# Patient Record
Sex: Female | Born: 1945 | Race: White | Hispanic: No | Marital: Married | Smoking: Never smoker
Health system: Southern US, Community
[De-identification: ages and names within clinical notes are randomized; demographics above are authoritative.]

## PROBLEM LIST (undated history)

## (undated) DIAGNOSIS — H1851 Endothelial corneal dystrophy: Secondary | ICD-10-CM

## (undated) DIAGNOSIS — L409 Psoriasis, unspecified: Secondary | ICD-10-CM

## (undated) DIAGNOSIS — I10 Essential (primary) hypertension: Secondary | ICD-10-CM

## (undated) DIAGNOSIS — K219 Gastro-esophageal reflux disease without esophagitis: Secondary | ICD-10-CM

## (undated) DIAGNOSIS — H269 Unspecified cataract: Secondary | ICD-10-CM

## (undated) DIAGNOSIS — M1990 Unspecified osteoarthritis, unspecified site: Secondary | ICD-10-CM

## (undated) DIAGNOSIS — D0510 Intraductal carcinoma in situ of unspecified breast: Secondary | ICD-10-CM

## (undated) DIAGNOSIS — K5190 Ulcerative colitis, unspecified, without complications: Secondary | ICD-10-CM

## (undated) DIAGNOSIS — M810 Age-related osteoporosis without current pathological fracture: Secondary | ICD-10-CM

## (undated) DIAGNOSIS — E559 Vitamin D deficiency, unspecified: Secondary | ICD-10-CM

## (undated) HISTORY — DX: Psoriasis, unspecified: L40.9

## (undated) HISTORY — DX: Essential (primary) hypertension: I10

## (undated) HISTORY — DX: Age-related osteoporosis without current pathological fracture: M81.0

## (undated) HISTORY — DX: Vitamin D deficiency, unspecified: E55.9

## (undated) HISTORY — DX: Ulcerative colitis, unspecified, without complications: K51.90

## (undated) HISTORY — DX: Unspecified cataract: H26.9

## (undated) HISTORY — DX: Endothelial corneal dystrophy: H18.51

## (undated) HISTORY — DX: Unspecified osteoarthritis, unspecified site: M19.90

## (undated) HISTORY — DX: Intraductal carcinoma in situ of unspecified breast: D05.10

---

## 1976-08-11 HISTORY — PX: TUBAL LIGATION: SHX77

## 1983-08-12 DIAGNOSIS — K5190 Ulcerative colitis, unspecified, without complications: Secondary | ICD-10-CM

## 1983-08-12 HISTORY — DX: Ulcerative colitis, unspecified, without complications: K51.90

## 2006-08-11 DIAGNOSIS — H18519 Endothelial corneal dystrophy, unspecified eye: Secondary | ICD-10-CM

## 2006-08-11 HISTORY — DX: Endothelial corneal dystrophy, unspecified eye: H18.519

## 2011-08-12 HISTORY — PX: OTHER SURGICAL HISTORY: SHX169

## 2013-08-11 DIAGNOSIS — I10 Essential (primary) hypertension: Secondary | ICD-10-CM

## 2013-08-11 HISTORY — DX: Essential (primary) hypertension: I10

## 2016-08-11 HISTORY — PX: BREAST BIOPSY: SHX20

## 2016-12-25 DIAGNOSIS — H269 Unspecified cataract: Secondary | ICD-10-CM

## 2016-12-25 HISTORY — DX: Unspecified cataract: H26.9

## 2017-01-21 ENCOUNTER — Other Ambulatory Visit: Payer: Self-pay | Admitting: Family Medicine

## 2017-01-21 DIAGNOSIS — N6489 Other specified disorders of breast: Secondary | ICD-10-CM

## 2017-01-23 ENCOUNTER — Ambulatory Visit
Admission: RE | Admit: 2017-01-23 | Discharge: 2017-01-23 | Disposition: A | Discharge status: Home / Self Care | Payer: Medicare Other | Source: Ambulatory Visit | Attending: Family Medicine | Admitting: Family Medicine

## 2017-01-23 ENCOUNTER — Other Ambulatory Visit: Payer: Self-pay | Admitting: Family Medicine

## 2017-01-23 DIAGNOSIS — N6489 Other specified disorders of breast: Secondary | ICD-10-CM

## 2017-01-23 DIAGNOSIS — D0510 Intraductal carcinoma in situ of unspecified breast: Secondary | ICD-10-CM

## 2017-01-23 HISTORY — DX: Intraductal carcinoma in situ of unspecified breast: D05.10

## 2017-01-23 HISTORY — PX: BIOPSY BREAST: PRO8

## 2017-03-04 ENCOUNTER — Encounter: Payer: Self-pay | Admitting: Hematology and Oncology

## 2017-03-04 ENCOUNTER — Telehealth: Payer: Self-pay | Admitting: Hematology and Oncology

## 2017-03-04 NOTE — Telephone Encounter (Signed)
Appt has been scheduled for the Janet Wiggins to see Dr. Pamelia HoitGudena on 7/31 at 345pm. Alisha from [facility] has notified the Janet Wiggins of the appt. Letter mailed.

## 2017-03-09 ENCOUNTER — Telehealth: Payer: Self-pay | Admitting: *Deleted

## 2017-03-09 NOTE — Telephone Encounter (Signed)
Clinical Research Note:  Patient referred by Dr. Dwain SarnaWakefield for Comet AFT-25 study.   She is scheduled to see Dr. Pamelia HoitGudena tomorrow afternoon.  Called patient to introduce myself and ask if she is still interested in learning about this clinical trial.  Patient states she is interested in learning more about the study.  I will plan to provide her with information about the study and answer any questions she may have after speaking with Dr. Pamelia HoitGudena.   Patient agreed to have research nurse present on her visit w/ Dr. Pamelia HoitGudena tomorrow and to meet afterwards.  Thanked patient for her time and interest in this study and look forward to meeting her tomorrow.  Domenica Reamerameo Windham, BSN, RN Clinical Research Nurse 03/09/2017 11:34 AM

## 2017-03-10 ENCOUNTER — Encounter: Payer: Self-pay | Admitting: Hematology and Oncology

## 2017-03-10 ENCOUNTER — Encounter: Payer: Self-pay | Admitting: *Deleted

## 2017-03-10 ENCOUNTER — Ambulatory Visit (HOSPITAL_BASED_OUTPATIENT_CLINIC_OR_DEPARTMENT_OTHER): Payer: Medicare Other | Admitting: Hematology and Oncology

## 2017-03-10 DIAGNOSIS — D0512 Intraductal carcinoma in situ of left breast: Secondary | ICD-10-CM | POA: Diagnosis not present

## 2017-03-10 DIAGNOSIS — Z170 Estrogen receptor positive status [ER+]: Secondary | ICD-10-CM

## 2017-03-10 DIAGNOSIS — D0511 Intraductal carcinoma in situ of right breast: Secondary | ICD-10-CM

## 2017-03-10 NOTE — Progress Notes (Signed)
[facility] CONSULT NOTE  Patient Care Team: Gillian ScarceZanard, Robyn K, MD as PCP - General (Family Medicine)  CHIEF COMPLAINTS/PURPOSE OF CONSULTATION:  Newly diagnosed bilateral DCIS  HISTORY OF PRESENTING ILLNESS:  Janet Wiggins 71 y.o. female is here because of recent diagnosis of bilateral DCIS patient has not had a mammogram for several years and had a routine screening mammogram since she moved to [place] to be close to her family. The mammogram revealed bilateral breast abnormalities. She underwent biopsies of both of the breasts and both came back as low to do. Grade DCIS. Both were ER/PR positive. She was seen by surgery and was referred to was for discussion regarding treatment options. She was informed about the common clinical trial and was interested in this. She is accompanied today by her son and her husband.   I reviewed her records extensively and collaborated the history with the patient.  SUMMARY OF ONCOLOGIC HISTORY:   Ductal carcinoma in situ (DCIS) of right breast   01/23/2017 Initial Diagnosis    Right breast biopsy 12:30 position: DCIS involving sclerosing adenosis ER 100%, PR 100%; left breast upper medial biopsy: DCIS involving sclerosing adenosis, low to intermediate grade, ER 100%, PR 95% , Tis Nx stage 0     DCIS left breast   MEDICAL HISTORY:   long-standing history of ulcerative colitis  SURGICAL HISTORY:  recent cataract surgeries  SOCIAL HISTORY:Denies any tobacco alcohol integration drug use  FAMILY HISTORY: No family history of breast cancer   ALLERGIES:  has No Known Allergies.  MEDICATIONS:  Current Outpatient Prescriptions  Medication Sig Dispense Refill  . aspirin 81 MG chewable tablet Chew 81 mg by mouth daily.    . diclofenac sodium (VOLTAREN) 1 % GEL Apply topically as needed.    . ergocalciferol (VITAMIN D2) 50000 units capsule Take 50,000 Units by mouth every 14 (fourteen) days.    . fluocinonide ointment (LIDEX)  0.05 % Apply 1 application topically as needed.    Marland Kitchen. glucosamine-chondroitin 500-400 MG tablet Take 1 tablet by mouth daily.    Marland Kitchen. lisinopril (PRINIVIL,ZESTRIL) 20 MG tablet Take 20 mg by mouth daily.    . mesalamine (LIALDA) 1.2 g EC tablet Take by mouth every evening.    . Multiple Vitamin (MULTIVITAMIN) tablet Take 1 tablet by mouth daily.    . zoledronic acid (RECLAST) 5 MG/100ML SOLN injection Inject 5 mg into the vein once. Once a year. Last given in December 2017.     No current facility-administered medications for this visit.     REVIEW OF SYSTEMS:   Constitutional: Denies fevers, chills or abnormal night sweats Eyes: Denies blurriness of vision, double vision or watery eyes Ears, nose, mouth, throat, and face: Denies mucositis or sore throat Respiratory: Denies cough, dyspnea or wheezes Cardiovascular: Denies palpitation, chest discomfort or lower extremity swelling Gastrointestinal:  Denies nausea, heartburn or change in bowel habits Skin: Denies abnormal skin rashes Lymphatics: Denies new lymphadenopathy or easy bruising Neurological:Denies numbness, tingling or new weaknesses Behavioral/Psych: Mood is stable, no new changes  Breast:  Denies any palpable lumps or discharge All other systems were reviewed with the patient and are negative.  PHYSICAL EXAMINATION: ECOG PERFORMANCE STATUS: 0 - Asymptomatic  Vitals:   03/10/17 1554  BP: (!) 145/79  Pulse: 76  Resp: 18  Temp: 98.4 F (36.9 C)   Filed Weights   03/10/17 1554  Weight: 185 lb 14.4 oz (84.3 kg)    GENERAL:alert, no distress and comfortable SKIN:  skin color, texture, turgor are normal, no rashes or significant lesions EYES: normal, conjunctiva are pink and non-injected, sclera clear OROPHARYNX:no exudate, no erythema and lips, buccal mucosa, and tongue normal  NECK: supple, thyroid normal size, non-tender, without nodularity LYMPH:  no palpable lymphadenopathy in the cervical, axillary or inguinal LUNGS:  clear to auscultation and percussion with normal breathing effort HEART: regular rate & rhythm and no murmurs and no lower extremity edema ABDOMEN:abdomen soft, non-tender and normal bowel sounds Musculoskeletal:no cyanosis of digits and no clubbing  PSYCH: alert & oriented x 3 with fluent speech NEURO: no focal motor/sensory deficits BREAST: No palpable nodules in breast. No palpable axillary or supraclavicular lymphadenopathy (exam performed in the presence of a chaperone)   ASSESSMENT AND PLAN:  Ductal carcinoma in situ (DCIS) of right breast Right breast biopsy 12:30 position: DCIS involving sclerosing adenosis ER 100%, PR 100%;  Left breast upper medial biopsy: DCIS involving alcohol sclerosing adenosis, low to intermediate grade, ER 100%, PR 95% ,  Bilateral breast DCIS Tis Nx stage 0  Pathology review: I discussed with the patient the difference between DCIS and invasive breast cancer. It is considered a precancerous lesion. DCIS is classified as a 0. It is generally detected through mammograms as calcifications. We discussed the significance of grades and its impact on prognosis. We also discussed the importance of ER and PR receptors and their implications to adjuvant treatment options. Prognosis of DCIS dependence on grade, comedo necrosis. It is anticipated that if not treated, 20-30% of DCIS can develop into invasive breast cancer.  Recommendation: 1. Breast conserving surgeries Vs COMET Clinical Trial (surgery versus active surveillance) 2. Followed by adjuvant radiation therapy 3. Followed by antiestrogen therapy with tamoxifen 5 years  Tamoxifen counseling: We discussed the risks and benefits of tamoxifen. These include but not limited to insomnia, hot flashes, mood changes, vaginal dryness, and weight gain. Although rare, serious side effects including endometrial cancer, risk of blood clots were also discussed. We strongly believe that the benefits far outweigh the risks.  Patient understands these risks  AFT 25 COMET Phase 3 clinical trial for low risk DCIS grade 1/2 PR positive, age greater than 40 randomized to surgery +/- radiation, +/- endocrine therapy versus active surveillance with +/- endocrine therapy surveillance with mammograms every 6 months for 5 years;patient's have option to decline elevated arm and still be followed on study   Patient was provided a lot of information about the clinical trial and she will get back to us with her decision regarding participating in this important trial.  All questions were answered. The patient knows to call the clinic with any problems, questions or concerns.    Sabas SousGudena, Windi Toro K, MD 03/10/17

## 2017-03-10 NOTE — Assessment & Plan Note (Signed)
Right breast biopsy 12:30 position: DCIS involving sclerosing adenosis ER 100%, PR 100%;  Left breast upper medial biopsy: DCIS involving alcohol sclerosing adenosis, low to intermediate grade, ER 100%, PR 95% ,  Bilateral breast DCIS Tis Nx stage 0  Pathology review: I discussed with the patient the difference between DCIS and invasive breast cancer. It is considered a precancerous lesion. DCIS is classified as a 0. It is generally detected through mammograms as calcifications. We discussed the significance of grades and its impact on prognosis. We also discussed the importance of ER and PR receptors and their implications to adjuvant treatment options. Prognosis of DCIS dependence on grade, comedo necrosis. It is anticipated that if not treated, 20-30% of DCIS can develop into invasive breast cancer.  Recommendation: 1. Breast conserving surgeries 2. Followed by adjuvant radiation therapy 3. Followed by antiestrogen therapy with tamoxifen 5 years  Tamoxifen counseling: We discussed the risks and benefits of tamoxifen. These include but not limited to insomnia, hot flashes, mood changes, vaginal dryness, and weight gain. Although rare, serious side effects including endometrial cancer, risk of blood clots were also discussed. We strongly believe that the benefits far outweigh the risks. Patient understands these risks and consented to starting treatment. Planned treatment duration is 5 years.  Return to clinic after surgery to discuss the final pathology report and come up with an adjuvant treatment plan.

## 2017-03-18 ENCOUNTER — Encounter: Payer: Self-pay | Admitting: *Deleted

## 2017-03-18 ENCOUNTER — Telehealth: Payer: Self-pay | Admitting: *Deleted

## 2017-03-18 NOTE — Telephone Encounter (Signed)
Called pt to discuss decision on whether she wanted to move forward with surgery or COMET trial. Pt relate she has decided for now she will move forward with the COMET trial. She is scheduled to meet with research to discuss details on 8/13. Denies further needs or questions at this time.

## 2017-03-19 ENCOUNTER — Other Ambulatory Visit: Payer: Self-pay | Admitting: *Deleted

## 2017-03-19 DIAGNOSIS — D0511 Intraductal carcinoma in situ of right breast: Secondary | ICD-10-CM

## 2017-03-19 DIAGNOSIS — D0512 Intraductal carcinoma in situ of left breast: Secondary | ICD-10-CM

## 2017-03-20 ENCOUNTER — Telehealth: Payer: Self-pay | Admitting: *Deleted

## 2017-03-20 NOTE — Telephone Encounter (Signed)
Research Note for Comet study (Aft-25) Called patient to notify her she is not eligible for the Comet study due to no measurable disease on imaging studies.  Thanked patient for her time and willingness to participate in this study. She verbalized understanding.   Informed patient that she can choose her treatment options for DCIS off study. She can continue to see Dr. Pamelia HoitGudena and be followed on tamoxifen without surgery and radiation or she can choose to proceed with surgery and radiation followed by anti-hormone treatment. Patient says she thinks she will probably want to start on Tamoxifen and be followed by Dr. Pamelia HoitGudena without surgery and radiation.  She will think about it over the weekend and I will ask the Breast Navigator or Collaborative RN to follow up with her next week to discuss further.  Patient verbalized understanding.  Domenica Reamerameo Kaysen Deal, BSN, RN Clinical Research Nurse 03/20/2017 2:57 PM

## 2017-03-23 ENCOUNTER — Other Ambulatory Visit: Payer: Medicare Other

## 2017-03-23 ENCOUNTER — Encounter: Payer: Medicare Other | Admitting: *Deleted

## 2017-03-24 ENCOUNTER — Other Ambulatory Visit: Payer: Self-pay | Admitting: Gastroenterology

## 2017-03-24 DIAGNOSIS — R1310 Dysphagia, unspecified: Secondary | ICD-10-CM

## 2017-03-30 ENCOUNTER — Telehealth: Payer: Self-pay | Admitting: Hematology and Oncology

## 2017-03-30 NOTE — Telephone Encounter (Signed)
sw pt husband to confirm 8/28 appt at 145 pm per sch msg

## 2017-04-06 ENCOUNTER — Ambulatory Visit
Admission: RE | Admit: 2017-04-06 | Discharge: 2017-04-06 | Disposition: A | Discharge status: Home / Self Care | Payer: Medicare Other | Source: Ambulatory Visit | Attending: Gastroenterology | Admitting: Gastroenterology

## 2017-04-06 DIAGNOSIS — R1310 Dysphagia, unspecified: Secondary | ICD-10-CM

## 2017-04-07 ENCOUNTER — Ambulatory Visit (HOSPITAL_BASED_OUTPATIENT_CLINIC_OR_DEPARTMENT_OTHER): Payer: Medicare Other | Admitting: Hematology and Oncology

## 2017-04-07 ENCOUNTER — Encounter: Payer: Self-pay | Admitting: Hematology and Oncology

## 2017-04-07 VITALS — BP 127/84 | HR 83 | Temp 98.7°F | Resp 18 | Ht 64.75 in | Wt 184.7 lb

## 2017-04-07 DIAGNOSIS — Z79810 Long term (current) use of selective estrogen receptor modulators (SERMs): Secondary | ICD-10-CM | POA: Diagnosis not present

## 2017-04-07 DIAGNOSIS — D0512 Intraductal carcinoma in situ of left breast: Secondary | ICD-10-CM

## 2017-04-07 DIAGNOSIS — D0511 Intraductal carcinoma in situ of right breast: Secondary | ICD-10-CM | POA: Diagnosis not present

## 2017-04-07 DIAGNOSIS — Z170 Estrogen receptor positive status [ER+]: Secondary | ICD-10-CM | POA: Diagnosis not present

## 2017-04-07 MED ORDER — TAMOXIFEN CITRATE 20 MG PO TABS: 20.0000 mg | ORAL_TABLET | Freq: Every day | ORAL | 3 refills | Status: DC

## 2017-04-07 NOTE — Assessment & Plan Note (Signed)
Right breast biopsy 12:30 position: DCIS involving sclerosing adenosis ER 100%, PR 100%;  Left breast upper medial biopsy: DCIS involving alcohol sclerosing adenosis, low to intermediate grade, ER 100%, PR 95% ,  Bilateral breast DCIS Tis Nx stage 0  Patient was not eligible to participate in COMET clinical trial because she did not have measurable disease. Patient's preference is not to undergo surgery.  Treatment plan: 1. Antiestrogen therapy with tamoxifen 2. every 6 month mammograms and breast exams and follow-up.  I sent a prescription for tamoxifen today. Return to clinic in 6 months with mammogram and follow-up.

## 2017-04-07 NOTE — Progress Notes (Signed)
Patient Care Team: Gillian Scarce, MD as PCP - General (Family Medicine)  DIAGNOSIS:  Encounter Diagnoses  Name Primary?  . Ductal carcinoma in situ (DCIS) of right breast   . Ductal carcinoma in situ (DCIS) of left breast Yes    SUMMARY OF ONCOLOGIC HISTORY:   Ductal carcinoma in situ (DCIS) of right breast   01/23/2017 Initial Diagnosis    Right breast biopsy 12:30 position: DCIS involving sclerosing adenosis ER 100%, PR 100%; left breast upper medial biopsy: DCIS involving sclerosing adenosis, low to intermediate grade, ER 100%, PR 95% , Tis Nx stage 0      04/07/2017 -  Anti-estrogen oral therapy    Tamoxifen 20 mg daily, patient decided to go on active surveillance with every 6 month mammograms       CHIEF COMPLIANT: Follow-up of DCIS in bilateral breasts  INTERVAL HISTORY: Janet Wiggins is a 71 year old with above-mentioned history of DCIS of bilateral breasts was here for follow-up to discuss the treatment plan. We originally wanted her to participate in a clinical trial called comet however she did not qualify for the trial because she did not have measurable disease. She however decided to go on active surveillance without undergoing surgery. She is here to discuss that option along with starting tamoxifen therapy.  REVIEW OF SYSTEMS:   Constitutional: Denies fevers, chills or abnormal weight loss Eyes: Denies blurriness of vision Ears, nose, mouth, throat, and face: Denies mucositis or sore throat Respiratory: Denies cough, dyspnea or wheezes Cardiovascular: Denies palpitation, chest discomfort Gastrointestinal:  Denies nausea, heartburn or change in bowel habits Skin: Denies abnormal skin rashes Lymphatics: Denies new lymphadenopathy or easy bruising Neurological:Denies numbness, tingling or new weaknesses Behavioral/Psych: Mood is stable, no new changes  Extremities: No lower extremity edema Breast:  denies any pain or lumps or nodules in either breasts All other  systems were reviewed with the patient and are negative.  I have reviewed the past medical history, past surgical history, social history and family history with the patient and they are unchanged from previous note.  ALLERGIES:  has No Known Allergies.  MEDICATIONS:  Current Outpatient Prescriptions  Medication Sig Dispense Refill  . aspirin 81 MG chewable tablet Chew 81 mg by mouth daily.    . diclofenac sodium (VOLTAREN) 1 % GEL Apply topically as needed.    . ergocalciferol (VITAMIN D2) 50000 units capsule Take 50,000 Units by mouth every 14 (fourteen) days.    . fluocinonide ointment (LIDEX) 0.05 % Apply 1 application topically as needed.    Marland Kitchen glucosamine-chondroitin 500-400 MG tablet Take 1 tablet by mouth daily.    Marland Kitchen lisinopril (PRINIVIL,ZESTRIL) 20 MG tablet Take 20 mg by mouth daily.    . mesalamine (LIALDA) 1.2 g EC tablet Take by mouth every evening.    . Multiple Vitamin (MULTIVITAMIN) tablet Take 1 tablet by mouth daily.    . tamoxifen (NOLVADEX) 20 MG tablet Take 1 tablet (20 mg total) by mouth daily. 90 tablet 3  . zoledronic acid (RECLAST) 5 MG/100ML SOLN injection Inject 5 mg into the vein once. Once a year. Last given in December 2017.     No current facility-administered medications for this visit.     PHYSICAL EXAMINATION: ECOG PERFORMANCE STATUS: 0 - Asymptomatic  Vitals:   04/07/17 1402  BP: 127/84  Pulse: 83  Resp: 18  Temp: 98.7 F (37.1 C)  SpO2: 96%   Filed Weights   04/07/17 1402  Weight: 184 lb 11.2 oz (83.8 kg)  GENERAL:alert, no distress and comfortable SKIN: skin color, texture, turgor are normal, no rashes or significant lesions EYES: normal, Conjunctiva are pink and non-injected, sclera clear OROPHARYNX:no exudate, no erythema and lips, buccal mucosa, and tongue normal  NECK: supple, thyroid normal size, non-tender, without nodularity LYMPH:  no palpable lymphadenopathy in the cervical, axillary or inguinal LUNGS: clear to auscultation  and percussion with normal breathing effort HEART: regular rate & rhythm and no murmurs and no lower extremity edema ABDOMEN:abdomen soft, non-tender and normal bowel sounds MUSCULOSKELETAL:no cyanosis of digits and no clubbing  NEURO: alert & oriented x 3 with fluent speech, no focal motor/sensory deficits EXTREMITIES: No lower extremity edema BREAST: No palpable masses or nodules in either right or left breasts. No palpable axillary supraclavicular or infraclavicular adenopathy no breast tenderness or nipple discharge. 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 Patient's preference is not to undergo surgery.  Treatment plan: 1. Antiestrogen therapy with tamoxifen 20 mg daily starting 04/07/2017 2. every 6 month mammograms and breast exams and follow-up.  I sent a prescription for tamoxifen today. Return to clinic in 3 months for toxicity evaluation      I spent 25 minutes talking to the patient of which more than half was spent in counseling and coordination of care.  Orders Placed This Encounter  Procedures  . MM DIAG BREAST TOMO BILATERAL    Standing Status:   Future    Standing Expiration Date:   04/07/2018    Order Specific Question:   Reason for Exam (SYMPTOM  OR DIAGNOSIS REQUIRED)    Answer:    Every 6 month mammogram for DCIS under active surveillance    Order Specific Question:   Preferred imaging location?    Answer:   [facility]   The patient has a good understanding of the overall plan. she agrees with it. she will call with any problems that may develop before the next visit here.   Sabas Sous, MD 04/07/17

## 2017-04-09 ENCOUNTER — Other Ambulatory Visit: Payer: Self-pay

## 2017-04-09 MED ORDER — TAMOXIFEN CITRATE 20 MG PO TABS: 20.0000 mg | ORAL_TABLET | Freq: Every day | ORAL | 3 refills | Status: DC

## 2017-07-13 ENCOUNTER — Ambulatory Visit: Payer: Medicare Other | Admitting: Hematology and Oncology

## 2017-07-23 ENCOUNTER — Ambulatory Visit
Admission: RE | Admit: 2017-07-23 | Discharge: 2017-07-23 | Disposition: A | Discharge status: Home / Self Care | Payer: Medicare Other | Source: Ambulatory Visit | Attending: Hematology and Oncology | Admitting: Hematology and Oncology

## 2017-07-23 DIAGNOSIS — D0511 Intraductal carcinoma in situ of right breast: Secondary | ICD-10-CM

## 2017-07-27 NOTE — Assessment & Plan Note (Signed)
Right breast biopsy 12:30 position: DCIS involving sclerosing adenosis ER 100%, PR 100%;  Left breast upper medial biopsy: DCIS involving alcohol sclerosing adenosis, low to intermediate grade, ER 100%, PR 95% ,  Bilateral breast DCIS Tis Nx stage 0  Patient was not eligible to participate in COMET clinical trial because she did not have measurable disease. Patient's preference is not to undergo surgery.  Treatment plan: 1. Antiestrogen therapy with tamoxifen 20 mg daily starting 04/07/2017 2. every 6 month mammograms and breast exams and follow-up.   Return to clinic in 6 months for toxicity evaluation

## 2017-07-28 ENCOUNTER — Telehealth: Payer: Self-pay | Admitting: Hematology and Oncology

## 2017-07-28 ENCOUNTER — Ambulatory Visit (HOSPITAL_BASED_OUTPATIENT_CLINIC_OR_DEPARTMENT_OTHER): Payer: Medicare Other | Admitting: Hematology and Oncology

## 2017-07-28 DIAGNOSIS — Z79810 Long term (current) use of selective estrogen receptor modulators (SERMs): Secondary | ICD-10-CM | POA: Diagnosis not present

## 2017-07-28 DIAGNOSIS — D0511 Intraductal carcinoma in situ of right breast: Secondary | ICD-10-CM

## 2017-07-28 NOTE — Telephone Encounter (Signed)
Gave patient avs and calendar with appts per 12/18 los.  °

## 2017-07-28 NOTE — Progress Notes (Signed)
Patient Care Team: Gillian ScarceZanard, Robyn K, MD as PCP - General (Family Medicine)  DIAGNOSIS:  Encounter Diagnosis  Name Primary?  . Ductal carcinoma in situ (DCIS) of right breast     SUMMARY OF ONCOLOGIC HISTORY:   Ductal carcinoma in situ (DCIS) of right breast   01/23/2017 Initial Diagnosis    Right breast biopsy 12:30 position: DCIS involving sclerosing adenosis ER 100%, PR 100%; left breast upper medial biopsy: DCIS involving sclerosing adenosis, low to intermediate grade, ER 100%, PR 95% , Tis Nx stage 0      04/07/2017 -  Anti-estrogen oral therapy    Tamoxifen 20 mg daily, patient decided to go on active surveillance with every 6 month mammograms       CHIEF COMPLIANT: Follow-up of DCIS on tamoxifen  INTERVAL HISTORY: Liborio NixonJanice Trow is a 71 year old with above-mentioned history of right and left breast DCIS who is currently on tamoxifen therapy.  She did not want to undergo surgery.  She is under surveillance.  Recent mammogram showed slight increase in the density around bilateral breast which could be post biopsy changes.  They are recommending a 6159-month interval mammogram.  She denies any pain discomfort lumps or nodules.  REVIEW OF SYSTEMS:   Constitutional: Denies fevers, chills or abnormal weight loss Eyes: Denies blurriness of vision Ears, nose, mouth, throat, and face: Denies mucositis or sore throat Respiratory: Denies cough, dyspnea or wheezes Cardiovascular: Denies palpitation, chest discomfort Gastrointestinal:  Denies nausea, heartburn or change in bowel habits Skin: Denies abnormal skin rashes Lymphatics: Denies new lymphadenopathy or easy bruising Neurological:Denies numbness, tingling or new weaknesses Behavioral/Psych: Mood is stable, no new changes  Extremities: No lower extremity edema Breast:  denies any pain or lumps or nodules in either breasts All other systems were reviewed with the patient and are negative.  I have reviewed the past medical history,  past surgical history, social history and family history with the patient and they are unchanged from previous note.  ALLERGIES:  has No Known Allergies.  MEDICATIONS:  Current Outpatient Medications  Medication Sig Dispense Refill  . aspirin 81 MG chewable tablet Chew 81 mg by mouth daily.    . diclofenac sodium (VOLTAREN) 1 % GEL Apply topically as needed.    . ergocalciferol (VITAMIN D2) 50000 units capsule Take 50,000 Units by mouth every 14 (fourteen) days.    . fluocinonide ointment (LIDEX) 0.05 % Apply 1 application topically as needed.    Marland Kitchen. glucosamine-chondroitin 500-400 MG tablet Take 1 tablet by mouth daily.    Marland Kitchen. lisinopril (PRINIVIL,ZESTRIL) 20 MG tablet Take 20 mg by mouth daily.    . mesalamine (LIALDA) 1.2 g EC tablet Take by mouth every evening.    . Multiple Vitamin (MULTIVITAMIN) tablet Take 1 tablet by mouth daily.    . tamoxifen (NOLVADEX) 20 MG tablet Take 1 tablet (20 mg total) by mouth daily. 90 tablet 3  . zoledronic acid (RECLAST) 5 MG/100ML SOLN injection Inject 5 mg into the vein once. Once a year. Last given in December 2017.     No current facility-administered medications for this visit.     PHYSICAL EXAMINATION: ECOG PERFORMANCE STATUS: 1 - Symptomatic but completely ambulatory  Vitals:   07/28/17 1006  BP: 129/63  Pulse: 67  Resp: 18  Temp: 97.7 F (36.5 C)  SpO2: 99%   Filed Weights   07/28/17 1006  Weight: 184 lb 14.4 oz (83.9 kg)    GENERAL:alert, no distress and comfortable SKIN: skin color, texture, turgor  are normal, no rashes or significant lesions EYES: normal, Conjunctiva are pink and non-injected, sclera clear OROPHARYNX:no exudate, no erythema and lips, buccal mucosa, and tongue normal  NECK: supple, thyroid normal size, non-tender, without nodularity LYMPH:  no palpable lymphadenopathy in the cervical, axillary or inguinal LUNGS: clear to auscultation and percussion with normal breathing effort HEART: regular rate & rhythm and  no murmurs and no lower extremity edema ABDOMEN:abdomen soft, non-tender and normal bowel sounds MUSCULOSKELETAL:no cyanosis of digits and no clubbing  NEURO: alert & oriented x 3 with fluent speech, no focal motor/sensory deficits EXTREMITIES: No lower extremity edema BREAST: No palpable masses or nodules in either right or left breasts. No palpable axillary supraclavicular or infraclavicular adenopathy no breast tenderness or nipple discharge. 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 Patient's preference is not to undergo surgery.  Treatment plan: 1. Antiestrogen therapy with tamoxifen 20 mg daily starting 04/07/2017 2.  based on the recent mammogram performed on 07/23/2017, radiologist recommending a 5321-month follow-up mammogram.  I will set her up for 4221-month mammogram and follow-up afterwards.  Patient is planning to go to [place] and to [place] multiple cities and [place]   Return to clinic in 3 months after mammograms     I spent 25 minutes talking to the patient of which more than half was spent in counseling and coordination of care.  No orders of the defined types were placed in this encounter.  The patient has a good understanding of the overall plan. she agrees with it. she will  call with any problems that may develop before the next visit here.   Sabas SousGudena, Demetres Prochnow K, MD 07/28/17

## 2017-11-02 ENCOUNTER — Ambulatory Visit
Admission: RE | Admit: 2017-11-02 | Discharge: 2017-11-02 | Disposition: A | Discharge status: Home / Self Care | Payer: Medicare Other | Source: Ambulatory Visit | Attending: Hematology and Oncology | Admitting: Hematology and Oncology

## 2017-11-02 DIAGNOSIS — D0511 Intraductal carcinoma in situ of right breast: Secondary | ICD-10-CM

## 2017-11-05 ENCOUNTER — Inpatient Hospital Stay: Payer: Medicare Other | Attending: Hematology and Oncology | Admitting: Hematology and Oncology

## 2017-11-05 ENCOUNTER — Telehealth: Payer: Self-pay | Admitting: Hematology and Oncology

## 2017-11-05 VITALS — BP 147/64 | HR 74 | Temp 98.4°F | Resp 18 | Ht 64.75 in | Wt 185.2 lb

## 2017-11-05 DIAGNOSIS — D0512 Intraductal carcinoma in situ of left breast: Secondary | ICD-10-CM

## 2017-11-05 DIAGNOSIS — D0511 Intraductal carcinoma in situ of right breast: Secondary | ICD-10-CM

## 2017-11-05 DIAGNOSIS — Z79810 Long term (current) use of selective estrogen receptor modulators (SERMs): Secondary | ICD-10-CM | POA: Diagnosis not present

## 2017-11-05 NOTE — Progress Notes (Signed)
Patient Care Team: Gillian ScarceZanard, Robyn K, MD as PCP - General (Family Medicine)  DIAGNOSIS:  Encounter Diagnoses  Name Primary?  . Ductal carcinoma in situ (DCIS) of left breast Yes  . Ductal carcinoma in situ (DCIS) of right breast     SUMMARY OF ONCOLOGIC HISTORY:   Ductal carcinoma in situ (DCIS) of right breast   01/23/2017 Initial Diagnosis    Right breast biopsy 12:30 position: DCIS involving sclerosing adenosis ER 100%, PR 100%; left breast upper medial biopsy: DCIS involving sclerosing adenosis, low to intermediate grade, ER 100%, PR 95% , Tis Nx stage 0      04/07/2017 -  Anti-estrogen oral therapy    Tamoxifen 20 mg daily, patient decided to go on active surveillance with every 6 month mammograms       CHIEF COMPLIANT: Follow-up of DCIS on active surveillance  INTERVAL HISTORY: Janet NixonJanice Wiggins is a 72 year old with above-mentioned history of bilateral DCIS who is currently on surveillance.  She had a recent mammogram which showed no changes in the DCIS.  She is currently on tamoxifen and appears to be tolerating it well.  She has slightly high blood pressure which she is worried about.  She is also had a rash on the legs which has resolved.  She went to [place] recently and spent several weeks traveling through the inner city's in [place].  REVIEW OF SYSTEMS:   Constitutional: Denies fevers, chills or abnormal weight loss Eyes: Denies blurriness of vision Ears, nose, mouth, throat, and face: Denies mucositis or sore throat Respiratory: Denies cough, dyspnea or wheezes Cardiovascular: Denies palpitation, chest discomfort Gastrointestinal:  Denies nausea, heartburn or change in bowel habits Skin: Denies abnormal skin rashes Lymphatics: Denies new lymphadenopathy or easy bruising Neurological:Denies numbness, tingling or new weaknesses Behavioral/Psych: Mood is stable, no new changes  Extremities: No lower extremity edema Breast:  denies any pain or lumps or nodules in either  breasts All other systems were reviewed with the patient and are negative.  I have reviewed the past medical history, past surgical history, social history and family history with the patient and they are unchanged from previous note.  ALLERGIES:  has No Known Allergies.  MEDICATIONS:  Current Outpatient Medications  Medication Sig Dispense Refill  . aspirin 81 MG chewable tablet Chew 81 mg by mouth daily.    . diclofenac sodium (VOLTAREN) 1 % GEL Apply topically as needed.    . ergocalciferol (VITAMIN D2) 50000 units capsule Take 50,000 Units by mouth every 14 (fourteen) days.    . fluocinonide ointment (LIDEX) 0.05 % Apply 1 application topically as needed.    Marland Kitchen. glucosamine-chondroitin 500-400 MG tablet Take 1 tablet by mouth daily.    Marland Kitchen. lisinopril (PRINIVIL,ZESTRIL) 20 MG tablet Take 20 mg by mouth daily.    . mesalamine (LIALDA) 1.2 g EC tablet Take by mouth every evening.    . Multiple Vitamin (MULTIVITAMIN) tablet Take 1 tablet by mouth daily.    . tamoxifen (NOLVADEX) 20 MG tablet Take 1 tablet (20 mg total) by mouth daily. 90 tablet 3  . zoledronic acid (RECLAST) 5 MG/100ML SOLN injection Inject 5 mg into the vein once. Once a year. Last given in December 2017.     No current facility-administered medications for this visit.     PHYSICAL EXAMINATION: ECOG PERFORMANCE STATUS: 1 - Symptomatic but completely ambulatory  Vitals:   11/05/17 1157  BP: (!) 147/64  Pulse: 74  Resp: 18  Temp: 98.4 F (36.9 C)  SpO2: 98%  Filed Weights   11/05/17 1157  Weight: 185 lb 3.2 oz (84 kg)    GENERAL:alert, no distress and comfortable SKIN: skin color, texture, turgor are normal, no rashes or significant lesions EYES: normal, Conjunctiva are pink and non-injected, sclera clear OROPHARYNX:no exudate, no erythema and lips, buccal mucosa, and tongue normal  NECK: supple, thyroid normal size, non-tender, without nodularity LYMPH:  no palpable lymphadenopathy in the cervical, axillary  or inguinal LUNGS: clear to auscultation and percussion with normal breathing effort HEART: regular rate & rhythm and no murmurs and no lower extremity edema ABDOMEN:abdomen soft, non-tender and normal bowel sounds MUSCULOSKELETAL:no cyanosis of digits and no clubbing  NEURO: alert & oriented x 3 with fluent speech, no focal motor/sensory deficits EXTREMITIES: No lower extremity edema BREAST: No palpable masses or nodules in either right or left breasts. No palpable axillary supraclavicular or infraclavicular adenopathy no breast tenderness or nipple discharge. (exam performed in the presence of a chaperone)  LABORATORY DATA:  I have reviewed the data as listed No flowsheet data found.  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 Patient's preference is not to undergo surgery.  Treatment plan: 1.Antiestrogen therapy with tamoxifen20 mg daily starting 04/07/2017 2.mammogram 11/02/2017: Unchanged appearance of both breasts with biopsy clips within both upper breasts in the area of biopsy-proven DCIS.  Patient went to [place] and [place] in [place] 2019  Return to clinic in 6 months with mammograms and follow-up       Orders Placed This Encounter  Procedures  . MM DIAG BREAST TOMO BILATERAL    Standing Status:   Future    Standing Expiration Date:   11/06/2018    Order Specific Question:   Reason for Exam (SYMPTOM  OR DIAGNOSIS REQUIRED)    Answer:   DCIS on active surveillance without surgery    Order Specific Question:    Preferred imaging location?    Answer:   [facility]   The patient has a good understanding of the overall plan. she agrees with it. she will call with any problems that may develop before the next visit here.   Tamsen Meek, MD 11/05/17

## 2017-11-05 NOTE — Assessment & Plan Note (Signed)
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 Patient's preference is not to undergo surgery.  Treatment plan: 1.Antiestrogen therapy with tamoxifen20 mg daily starting 04/07/2017 2.mammogram 11/02/2017: Unchanged appearance of both breasts with biopsy clips within both upper breasts in the area of biopsy-proven DCIS.  Patient is planning to go to [place] and to her multiple cities and [place] in [place]  Return to clinic in 6 months with mammograms and follow-up

## 2017-11-05 NOTE — Telephone Encounter (Signed)
Gave patient AVs and calendar of upcoming October appointments.  °

## 2017-12-30 ENCOUNTER — Ambulatory Visit (HOSPITAL_COMMUNITY)
Admission: RE | Admit: 2017-12-30 | Discharge: 2017-12-30 | Disposition: A | Discharge status: Home / Self Care | Payer: Medicare Other | Source: Ambulatory Visit | Attending: Family Medicine | Admitting: Family Medicine

## 2017-12-30 ENCOUNTER — Encounter (HOSPITAL_COMMUNITY): Payer: Self-pay

## 2017-12-30 DIAGNOSIS — M810 Age-related osteoporosis without current pathological fracture: Secondary | ICD-10-CM | POA: Diagnosis not present

## 2017-12-30 HISTORY — DX: Gastro-esophageal reflux disease without esophagitis: K21.9

## 2017-12-30 MED ORDER — SODIUM CHLORIDE 0.9 % IV SOLN
Freq: Once | INTRAVENOUS | Status: AC
  Administered 2017-12-30: 08:00:00 via INTRAVENOUS

## 2017-12-30 MED ORDER — ZOLEDRONIC ACID 5 MG/100ML IV SOLN
5.0000 mg | Freq: Once | INTRAVENOUS | Status: AC
  Administered 2017-12-30: 5 mg via INTRAVENOUS
  Filled 2017-12-30: qty 100

## 2017-12-30 NOTE — Discharge Instructions (Signed)

## 2018-01-07 ENCOUNTER — Ambulatory Visit (HOSPITAL_COMMUNITY): Payer: Medicare Other

## 2018-04-15 ENCOUNTER — Other Ambulatory Visit: Payer: Self-pay | Admitting: Hematology and Oncology

## 2018-05-11 ENCOUNTER — Telehealth: Payer: Self-pay

## 2018-05-11 ENCOUNTER — Other Ambulatory Visit: Payer: Self-pay

## 2018-05-11 DIAGNOSIS — D0512 Intraductal carcinoma in situ of left breast: Secondary | ICD-10-CM

## 2018-05-11 DIAGNOSIS — D0511 Intraductal carcinoma in situ of right breast: Secondary | ICD-10-CM

## 2018-05-11 NOTE — Telephone Encounter (Signed)
Per patient and Dr. Pamelia Hoit recommendations for diagnostic mammograms Q 6 months.  Patient scheduled for mammogram 05/18/2018 @ 2pm, and follow up with Dr. Pamelia Hoit on 05/19/2018.  Patient aware and has no further needs at this time.

## 2018-05-18 ENCOUNTER — Ambulatory Visit
Admission: RE | Admit: 2018-05-18 | Discharge: 2018-05-18 | Disposition: A | Discharge status: Home / Self Care | Payer: Medicare Other | Source: Ambulatory Visit | Attending: Hematology and Oncology | Admitting: Hematology and Oncology

## 2018-05-18 DIAGNOSIS — D0511 Intraductal carcinoma in situ of right breast: Secondary | ICD-10-CM

## 2018-05-18 DIAGNOSIS — D0512 Intraductal carcinoma in situ of left breast: Secondary | ICD-10-CM

## 2018-05-19 ENCOUNTER — Telehealth: Payer: Self-pay | Admitting: Hematology and Oncology

## 2018-05-19 ENCOUNTER — Inpatient Hospital Stay: Payer: Medicare Other | Attending: Hematology and Oncology | Admitting: Hematology and Oncology

## 2018-05-19 DIAGNOSIS — Z79811 Long term (current) use of aromatase inhibitors: Secondary | ICD-10-CM | POA: Insufficient documentation

## 2018-05-19 DIAGNOSIS — D0511 Intraductal carcinoma in situ of right breast: Secondary | ICD-10-CM | POA: Insufficient documentation

## 2018-05-19 MED ORDER — TAMOXIFEN CITRATE 20 MG PO TABS: 20.0000 mg | ORAL_TABLET | Freq: Every day | ORAL | 3 refills | Status: DC

## 2018-05-19 NOTE — Progress Notes (Signed)
Patient Care Team: Gillian Scarce, MD as PCP - General (Family Medicine)  DIAGNOSIS:  Encounter Diagnosis  Name Primary?  . Ductal carcinoma in situ (DCIS) of right breast     SUMMARY OF ONCOLOGIC HISTORY:   Ductal carcinoma in situ (DCIS) of right breast   01/23/2017 Initial Diagnosis    Right breast biopsy 12:30 position: DCIS involving sclerosing adenosis ER 100%, PR 100%; left breast upper medial biopsy: DCIS involving sclerosing adenosis, low to intermediate grade, ER 100%, PR 95% , Tis Nx stage 0    04/07/2017 -  Anti-estrogen oral therapy    Tamoxifen 20 mg daily, patient decided to go on active surveillance with every 6 month mammograms     CHIEF COMPLIANT: Follow-up of her DCIS on tamoxifen  INTERVAL HISTORY: Janet Wiggins is a 72 year old who has a history of DCIS and is currently on tamoxifen.  She elected not to undergo surgery.  She is tolerating tamoxifen fairly well except for feeling very hot especially in the hot weather as well as some fatigue.  She is also on Reclast.  REVIEW OF SYSTEMS:   Constitutional: Denies fevers, chills or abnormal weight loss Eyes: Denies blurriness of vision Ears, nose, mouth, throat, and face: Denies mucositis or sore throat Respiratory: Denies cough, dyspnea or wheezes Cardiovascular: Denies palpitation, chest discomfort Gastrointestinal:  Denies nausea, heartburn or change in bowel habits Skin: Denies abnormal skin rashes Lymphatics: Denies new lymphadenopathy or easy bruising Neurological:Denies numbness, tingling or new weaknesses Behavioral/Psych: Mood is stable, no new changes  Extremities: No lower extremity edema Breast:  denies any pain or lumps or nodules in either breasts All other systems were reviewed with the patient and are negative.  I have reviewed the past medical history, past surgical history, social history and family history with the patient and they are unchanged from previous note.  ALLERGIES:  has No  Known Allergies.  MEDICATIONS:  Current Outpatient Medications  Medication Sig Dispense Refill  . aspirin 81 MG chewable tablet Chew 81 mg by mouth daily.    . diclofenac sodium (VOLTAREN) 1 % GEL Apply topically as needed.    . ergocalciferol (VITAMIN D2) 50000 units capsule Take 50,000 Units by mouth every 14 (fourteen) days.    . fluocinonide ointment (LIDEX) 0.05 % Apply 1 application topically as needed.    Marland Kitchen glucosamine-chondroitin 500-400 MG tablet Take 1 tablet by mouth daily.    Marland Kitchen lisinopril (PRINIVIL,ZESTRIL) 20 MG tablet Take 20 mg by mouth daily.    . mesalamine (LIALDA) 1.2 g EC tablet Take by mouth every evening.    . Multiple Vitamin (MULTIVITAMIN) tablet Take 1 tablet by mouth daily.    . tamoxifen (NOLVADEX) 20 MG tablet Take 1 tablet (20 mg total) by mouth daily. 90 tablet 3  . tamoxifen (NOLVADEX) 20 MG tablet TAKE ONE TABLET BY MOUTH DAILY 90 tablet 0  . zoledronic acid (RECLAST) 5 MG/100ML SOLN injection Inject 5 mg into the vein once. Once a year. Last given in December 2017.     No current facility-administered medications for this visit.     PHYSICAL EXAMINATION: ECOG PERFORMANCE STATUS: 1 - Symptomatic but completely ambulatory  Vitals:   05/19/18 1200  BP: 134/71  Pulse: 72  Resp: 16  Temp: 98 F (36.7 C)  SpO2: 99%   Filed Weights   05/19/18 1200  Weight: 189 lb 8 oz (86 kg)    GENERAL:alert, no distress and comfortable SKIN: skin color, texture, turgor are normal, no  rashes or significant lesions EYES: normal, Conjunctiva are pink and non-injected, sclera clear OROPHARYNX:no exudate, no erythema and lips, buccal mucosa, and tongue normal  NECK: supple, thyroid normal size, non-tender, without nodularity LYMPH:  no palpable lymphadenopathy in the cervical, axillary or inguinal LUNGS: clear to auscultation and percussion with normal breathing effort HEART: regular rate & rhythm and no murmurs and no lower extremity edema ABDOMEN:abdomen soft,  non-tender and normal bowel sounds MUSCULOSKELETAL:no cyanosis of digits and no clubbing  NEURO: alert & oriented x 3 with fluent speech, no focal motor/sensory deficits EXTREMITIES: No lower extremity edema BREAST: No palpable masses or nodules in either right or left breasts. No palpable axillary supraclavicular or infraclavicular adenopathy no breast tenderness or nipple discharge. 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 Patient's preference is not to undergo surgery.  Treatment plan: 1.Antiestrogen therapy with tamoxifen20 mg daily starting 04/07/2017 2.mammogram 05/18/2018: Areas of DCIS in the superior right and inner left breast are mammographically stable.  Patient went to [place]  Return to clinic in 6 months with mammograms and follow-up We will plan to do mammograms every 6 months for 2 years and then annually thereafter    No orders of the defined types were placed in this encounter.  The patient has a good understanding of the overall plan. she agrees with it. she will call with any problems that may develop before the next visit here.   Tamsen Meek, MD 05/19/18

## 2018-05-19 NOTE — Assessment & Plan Note (Signed)
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 Patient's preference is not to undergo surgery.  Treatment plan: 1.Antiestrogen therapy with tamoxifen20 mg daily starting 04/07/2017 2.mammogram 05/18/2018: Areas of DCIS in the superior right and inner left breast are mammographically stable.  Patient went to [place]  Return to clinic in 1 year with mammograms and follow-up

## 2018-05-19 NOTE — Telephone Encounter (Signed)
Gave pt avs and calendar  °

## 2018-11-16 NOTE — Assessment & Plan Note (Deleted)
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 Patient's preference is not to undergo surgery.  Treatment plan: 1.Antiestrogen therapy with tamoxifen20 mg daily starting 04/07/2017 2.mammogram 05/18/2018: Areas of DCIS in the superior right and inner left breast are mammographically stable.  [phone]  Mammogram will be postponed until the coronavirus infection is under control. We will plan to do mammograms every 6 months for 2 years and then annually thereafter

## 2018-11-22 ENCOUNTER — Telehealth: Payer: Self-pay | Admitting: Hematology and Oncology

## 2018-11-22 NOTE — Telephone Encounter (Signed)
Patient cancelled appt because she hasn't had he mammo yet. She said she would call back and reschedule when she has her mammo scheduled.

## 2018-11-25 ENCOUNTER — Ambulatory Visit: Payer: Medicare Other | Admitting: Hematology and Oncology

## 2019-02-08 ENCOUNTER — Telehealth: Payer: Self-pay

## 2019-02-08 NOTE — Telephone Encounter (Signed)
RN left message for patient to call back regarding scheduling of mammogram and follow up with MD.

## 2019-02-18 NOTE — Assessment & Plan Note (Signed)
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 Patient's preference is not to undergo surgery.  Treatment plan: 1.Antiestrogen therapy with tamoxifen20 mg daily starting 04/07/2017 2.mammogram  02/23/2019: Areas of DCIS in the superior right and inner left breast are mammographically stable.  Return to clinic in 6 months with mammograms and follow-up We will plan to do mammograms once a year from now.

## 2019-02-23 ENCOUNTER — Ambulatory Visit
Admission: RE | Admit: 2019-02-23 | Discharge: 2019-02-23 | Disposition: A | Discharge status: Home / Self Care | Payer: Medicare Other | Source: Ambulatory Visit | Attending: Hematology and Oncology | Admitting: Hematology and Oncology

## 2019-02-23 ENCOUNTER — Other Ambulatory Visit: Payer: Self-pay

## 2019-02-23 DIAGNOSIS — D0511 Intraductal carcinoma in situ of right breast: Secondary | ICD-10-CM

## 2019-02-24 NOTE — Progress Notes (Signed)
  HEMATOLOGY-ONCOLOGY [facility] VIDEO VISIT PROGRESS NOTE  I connected with Janet Wiggins on 02/25/2019 at 11:00 AM EDT by [place] and verified that I am speaking with the correct person using two identifiers.  I discussed the limitations, risks, security and privacy concerns of performing an evaluation and management service by [facility] and the availability of in person appointments.  I also discussed with the patient that there may be a patient responsible charge related to this service. The patient expressed understanding and agreed to proceed.  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 female with above-mentioned history of bilateral breast DCIS who is currently on neoadjuvant anti-estrogen therapy with tamoxifen, as she declined surgery. Mammogram on 02/23/19 showed the known DCIS in both breasts was stable. She presents over [facility] today for surveillance.   Oncology History  Ductal carcinoma in situ (DCIS) of right breast  01/23/2017 Initial Diagnosis   Right breast biopsy 12:30 position: DCIS involving sclerosing adenosis ER 100%, PR 100%; left breast upper medial biopsy: DCIS involving sclerosing adenosis, low to intermediate grade, ER 100%, PR 95% , Tis Nx stage 0   04/07/2017 -  Anti-estrogen oral therapy   Tamoxifen 20 mg daily, patient decided to go on active surveillance with every 6 month mammograms     REVIEW OF SYSTEMS:   Constitutional: Denies fevers, chills or abnormal weight loss Eyes: Denies blurriness of vision Ears, nose, mouth, throat, and face: Denies mucositis or sore throat Respiratory: Denies cough, dyspnea or wheezes Cardiovascular: Denies palpitation, chest discomfort Gastrointestinal:  Denies nausea, heartburn or change in bowel habits Skin: Denies abnormal skin rashes Lymphatics: Denies new lymphadenopathy or easy bruising  Neurological:Denies numbness, tingling or new weaknesses Behavioral/Psych: Mood is stable, no new changes  Extremities: No lower extremity edema Breast: denies any pain or lumps or nodules in either breasts All other systems were reviewed with the patient and are negative.  Observations/Objective:  No clinical evidence of disease progression.   Assessment Plan:  Ductal carcinoma in situ (DCIS) of right breast Right breast biopsy 12:30 position: DCIS involving sclerosing adenosis ER 100%, PR 100%;  Left breast upper medial biopsy: DCIS involving alcohol sclerosing adenosis, low to intermediate grade, ER 100%, PR 95% ,  Bilateral breast DCIS Tis Nx stage 0  Patient was not eligible to participate inCOMETclinical trial because she did not have measurable disease. Patient's preference is not to undergo surgery.  Treatment plan: 1.Antiestrogen therapy with tamoxifen20 mg daily starting 04/07/2017 2.mammogram  02/23/2019: Areas of DCIS in the superior right and inner left breast are mammographically stable.  We will plan to do mammograms once a year from now.    I discussed the assessment and treatment plan with the patient. The patient was provided an opportunity to ask questions and all were answered. The patient agreed with the plan and demonstrated an understanding of the instructions. The patient was advised to call back or seek an in-person evaluation if the symptoms worsen or if the condition fails to improve as anticipated.   I provided 15 minutes of face-to-face MyChart video visit time during this encounter.    Rulon Eisenmenger, MD 02/25/2019   I, Molly Dorshimer, am acting as scribe for Nicholas Lose, MD.  I have reviewed the above documentation for accuracy and completeness, and I agree with the above.

## 2019-02-25 ENCOUNTER — Inpatient Hospital Stay: Payer: Medicare Other | Attending: Hematology and Oncology | Admitting: Hematology and Oncology

## 2019-02-25 DIAGNOSIS — D0511 Intraductal carcinoma in situ of right breast: Secondary | ICD-10-CM | POA: Diagnosis present

## 2019-02-25 DIAGNOSIS — Z79811 Long term (current) use of aromatase inhibitors: Secondary | ICD-10-CM | POA: Diagnosis not present

## 2019-02-25 MED ORDER — TAMOXIFEN CITRATE 20 MG PO TABS: 20.0000 mg | ORAL_TABLET | Freq: Every day | ORAL | 3 refills | Status: DC

## 2019-10-01 ENCOUNTER — Ambulatory Visit: Payer: Medicare Other | Attending: Internal Medicine

## 2019-10-01 DIAGNOSIS — Z23 Encounter for immunization: Secondary | ICD-10-CM | POA: Insufficient documentation

## 2019-10-01 NOTE — Progress Notes (Signed)
   Covid-19 Vaccination Clinic  Name:  Janet Wiggins    MRN: [id number] DOB: 23-Sep-1945  10/01/2019  [name] was observed post Covid-19 immunization for 15 minutes without incidence. She was provided with Vaccine Information Sheet and instruction to access the V-Safe system.   [name] was instructed to call 911 with any severe reactions post vaccine: Marland Kitchen Difficulty breathing  . Swelling of your face and throat  . A fast heartbeat  . A bad rash all over your body  . Dizziness and weakness    Immunizations Administered    Name Date Dose VIS Date Route   Pfizer COVID-19 Vaccine 10/01/2019  9:06 AM 0.3 mL 07/22/2019 Intramuscular   Manufacturer: [employer], [employer]   Lot: [id number]   NDC: [address]-6

## 2019-10-25 ENCOUNTER — Ambulatory Visit: Payer: Medicare Other | Attending: Internal Medicine

## 2019-10-25 DIAGNOSIS — Z23 Encounter for immunization: Secondary | ICD-10-CM

## 2019-10-25 NOTE — Progress Notes (Signed)
   Covid-19 Vaccination Clinic  Name:  Janet Wiggins    MRN: [id number] DOB: [birth year]  10/25/2019  [name] was observed post Covid-19 immunization for 15 minutes without incident. She was provided with Vaccine Information Sheet and instruction to access the V-Safe system.   [name] was instructed to call 911 with any severe reactions post vaccine: Marland Kitchen Difficulty breathing  . Swelling of face and throat  . A fast heartbeat  . A bad rash all over body  . Dizziness and weakness   Immunizations Administered    Name Date Dose VIS Date Route   Pfizer COVID-19 Vaccine 10/25/2019  9:50 AM 0.3 mL 07/22/2019 Intramuscular   Manufacturer: [employer], [employer]   Lot: [id number]   NDC: [address]-9

## 2020-05-18 ENCOUNTER — Other Ambulatory Visit: Payer: Self-pay | Admitting: Hematology and Oncology

## 2020-05-21 ENCOUNTER — Other Ambulatory Visit: Payer: Self-pay | Admitting: Hematology and Oncology

## 2020-05-21 DIAGNOSIS — Z1231 Encounter for screening mammogram for malignant neoplasm of breast: Secondary | ICD-10-CM

## 2020-05-22 ENCOUNTER — Other Ambulatory Visit: Payer: Self-pay

## 2020-05-22 ENCOUNTER — Ambulatory Visit
Admission: RE | Admit: 2020-05-22 | Discharge: 2020-05-22 | Disposition: A | Discharge status: Home / Self Care | Payer: Medicare Other | Source: Ambulatory Visit | Attending: Hematology and Oncology | Admitting: Hematology and Oncology

## 2020-05-22 ENCOUNTER — Telehealth: Payer: Self-pay | Admitting: Hematology and Oncology

## 2020-05-22 ENCOUNTER — Other Ambulatory Visit: Payer: Self-pay | Admitting: Hematology and Oncology

## 2020-05-22 DIAGNOSIS — Z1231 Encounter for screening mammogram for malignant neoplasm of breast: Secondary | ICD-10-CM

## 2020-05-22 NOTE — Telephone Encounter (Signed)
Scheduled appointment per 10/12 scheduling message. Spoke to patient who is aware of appointment date and time.  

## 2020-05-27 NOTE — Progress Notes (Signed)
Patient Care Team: Gillian Scarce, MD as PCP - General (Family Medicine)  DIAGNOSIS:    ICD-10-CM   1. 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 with above-mentioned history of bilateral breast DCIS who is currently on neoadjuvant anti-estrogen therapy with tamoxifen, as she declined surgery. Mammogram on 02/23/19 showed no evidence of malignancy. She presents to the clinic today for surveillance.  She is tolerating tamoxifen fairly well with exception of hot flashes which wake her up intermittently at night.  Otherwise she is tolerating it fairly well.  ALLERGIES:  has No Known Allergies.  MEDICATIONS:  Current Outpatient Medications  Medication Sig Dispense Refill  . aspirin 81 MG chewable tablet Chew 81 mg by mouth daily.    . diclofenac sodium (VOLTAREN) 1 % GEL Apply topically as needed.    . ergocalciferol (VITAMIN D2) 50000 units capsule Take 50,000 Units by mouth every 14 (fourteen) days.    . fluocinonide ointment (LIDEX) 0.05 % Apply 1 application topically as needed.    Marland Kitchen glucosamine-chondroitin 500-400 MG tablet Take 1 tablet by mouth daily.    Marland Kitchen lisinopril (PRINIVIL,ZESTRIL) 20 MG tablet Take 20 mg by mouth daily.    . mesalamine (LIALDA) 1.2 g EC tablet Take by mouth every evening.    . Multiple Vitamin (MULTIVITAMIN) tablet Take 1 tablet by mouth daily.      . tamoxifen (NOLVADEX) 20 MG tablet TAKE ONE TABLET BY MOUTH DAILY 90 tablet 3  . zoledronic acid (RECLAST) 5 MG/100ML SOLN injection Inject 5 mg into the vein once. Once a year. Last given in December 2017.     No current facility-administered medications for this visit.    PHYSICAL EXAMINATION: ECOG PERFORMANCE STATUS: 1 - Symptomatic but completely ambulatory  Vitals:   05/28/20 1400  BP: (!) 145/70  Pulse: 74  Resp: 17  Temp: 97.9 F (36.6 C)  SpO2: 97%   Filed Weights   05/28/20 1400  Weight: 162 lb 9.6 oz (73.8 kg)    BREAST: No palpable masses or nodules in either right or left breasts. No palpable axillary supraclavicular or infraclavicular adenopathy no breast tenderness or nipple discharge. (exam performed in the presence of a chaperone)  LABORATORY DATA:  I have reviewed the data as listed No flowsheet data found.  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 Patient's preference is not to undergo surgery.  Treatment plan: 1.Antiestrogen therapy with tamoxifen20 mg daily started  04/07/2017 2.mammogram 05/25/2020 no mammographic appearance of malignancy.  Breast density category B.  Continue with annual mammograms and follow-up. Return to clinic in 1 year for MyChart virtual visit   No orders of the defined types were placed in this encounter.  The patient has a good understanding of the overall plan. she agrees with it. she will call with any problems that may develop before the next visit here.  Total time  spent: 20 mins including face to face time and time spent for planning, charting and coordination of care  Serena Croissant, MD 05/28/2020  I, Kirt Boys Dorshimer, am acting as scribe for Dr. Serena Croissant.  I have reviewed the above documentation for accuracy and completeness, and I agree with the above.

## 2020-05-28 ENCOUNTER — Inpatient Hospital Stay: Payer: Medicare Other | Attending: Hematology and Oncology | Admitting: Hematology and Oncology

## 2020-05-28 ENCOUNTER — Other Ambulatory Visit: Payer: Self-pay

## 2020-05-28 DIAGNOSIS — N951 Menopausal and female climacteric states: Secondary | ICD-10-CM | POA: Insufficient documentation

## 2020-05-28 DIAGNOSIS — D0511 Intraductal carcinoma in situ of right breast: Secondary | ICD-10-CM | POA: Insufficient documentation

## 2020-05-28 DIAGNOSIS — Z79810 Long term (current) use of selective estrogen receptor modulators (SERMs): Secondary | ICD-10-CM | POA: Diagnosis not present

## 2020-05-28 NOTE — Assessment & Plan Note (Signed)
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 Patient's preference is not to undergo surgery.  Treatment plan: 1.Antiestrogen therapy with tamoxifen20 mg daily starting 04/07/2017 2.mammogram 05/25/2020 no mammographic appearance of malignancy.  Breast density category B.  Continue with annual mammograms and follow-up.

## 2020-05-31 ENCOUNTER — Telehealth: Payer: Self-pay | Admitting: Hematology and Oncology

## 2020-05-31 NOTE — Telephone Encounter (Signed)
Per 10/18 LOS called patient and left message

## 2021-05-27 NOTE — Progress Notes (Signed)
  HEMATOLOGY-ONCOLOGY TELEPHONE PROGRESS NOTE  I connected with Debi Cousin Carollo on 05/28/2021 at  1:45 PM EDT by MyChart video conference and verified that I am speaking with the correct person using two identifiers.  I discussed the limitations, risks, security and privacy concerns of performing an evaluation and management service by MyChart and the availability of in person appointments.  I also discussed with the patient that there may be a patient responsible charge related to this service. The patient expressed understanding and agreed to proceed.  MyChart virtual visit did not work so we continued as a telephone visit.  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 female with above-mentioned history of bilateral breast DCIS who is currently on neoadjuvant anti-estrogen therapy with tamoxifen, as she declined surgery. Mammogram on 05/22/2020 showed no evidence of malignancy. She presents via MyChart today for follow-up.  Apart from hot flashes she is tolerating tamoxifen extremely well.  Oncology History  Ductal carcinoma in situ (DCIS) of right breast  01/23/2017 Initial Diagnosis   Right breast biopsy 12:30 position: DCIS involving sclerosing adenosis ER 100%, PR 100%; left breast upper medial biopsy: DCIS involving sclerosing adenosis, low to intermediate grade, ER 100%, PR 95% , Tis Nx stage 0   04/07/2017 -  Anti-estrogen oral therapy   Tamoxifen 20 mg daily, patient decided to go on active surveillance with every 6 month mammograms      Assessment Plan:  Ductal carcinoma in situ (DCIS) of right breast Right breast biopsy 12:30 position: DCIS involving sclerosing adenosis ER 100%, PR 100%;  Left breast upper medial biopsy: DCIS involving alcohol sclerosing adenosis, low to intermediate grade, ER 100%, PR 95% ,  Bilateral breast DCIS Tis Nx stage 0   Patient was not eligible to participate in COMET clinical trial  because she did not have measurable disease. Patient's preference is not to undergo surgery.   Treatment plan: 1. Antiestrogen therapy with tamoxifen 20 mg daily started 04/07/2017 2. mammogram 05/25/2020 no mammographic appearance of malignancy.  Breast density category B.    Continue with annual mammograms and follow-up. Return to clinic in 1 year for MyChart virtual visit      I discussed the assessment and treatment plan with the patient. The patient was provided an opportunity to ask questions and all were answered. The patient agreed with the plan and demonstrated an understanding of the instructions. The patient was advised to call back or seek an in-person evaluation if the symptoms worsen or if the condition fails to improve as anticipated.   Total time spent: 12 minutes including time spent for planning, charting and coordination of care  Sabas Sous, MD 05/28/2021  I, Alda Ponder am acting as scribe for Serena Croissant, MD.  I have reviewed the above documentation for accuracy and completeness, and I agree with the above.

## 2021-05-28 ENCOUNTER — Inpatient Hospital Stay: Payer: Medicare Other | Attending: Hematology and Oncology | Admitting: Hematology and Oncology

## 2021-05-28 DIAGNOSIS — D0511 Intraductal carcinoma in situ of right breast: Secondary | ICD-10-CM | POA: Diagnosis not present

## 2021-05-28 MED ORDER — TAMOXIFEN CITRATE 20 MG PO TABS: 20.0000 mg | ORAL_TABLET | Freq: Every day | ORAL | 3 refills | Status: DC

## 2021-05-28 NOTE — Assessment & Plan Note (Signed)
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 Patient's preference is not to undergo surgery.  Treatment plan: 1.Antiestrogen therapy with tamoxifen20 mg daily started 04/07/2017 2.mammogram 05/25/2020 no mammographic appearance of malignancy.  Breast density category B.  Continue with annual mammograms and follow-up. Return to clinic in 1 year for MyChart virtual visit

## 2021-06-05 ENCOUNTER — Other Ambulatory Visit: Payer: Self-pay | Admitting: Hematology and Oncology

## 2021-06-05 DIAGNOSIS — Z1231 Encounter for screening mammogram for malignant neoplasm of breast: Secondary | ICD-10-CM

## 2021-07-10 ENCOUNTER — Other Ambulatory Visit: Payer: Self-pay

## 2021-07-10 ENCOUNTER — Ambulatory Visit
Admission: RE | Admit: 2021-07-10 | Discharge: 2021-07-10 | Disposition: A | Discharge status: Home / Self Care | Payer: Medicare Other | Source: Ambulatory Visit | Attending: Hematology and Oncology | Admitting: Hematology and Oncology

## 2021-07-10 DIAGNOSIS — Z1231 Encounter for screening mammogram for malignant neoplasm of breast: Secondary | ICD-10-CM

## 2022-05-27 ENCOUNTER — Telehealth: Payer: Medicare Other | Admitting: Hematology and Oncology

## 2022-06-03 ENCOUNTER — Other Ambulatory Visit: Payer: Self-pay | Admitting: *Deleted

## 2022-06-03 ENCOUNTER — Other Ambulatory Visit: Payer: Self-pay | Admitting: Hematology and Oncology

## 2022-06-03 DIAGNOSIS — Z1231 Encounter for screening mammogram for malignant neoplasm of breast: Secondary | ICD-10-CM

## 2022-06-10 ENCOUNTER — Telehealth: Payer: Medicare Other | Admitting: Hematology and Oncology

## 2022-07-01 ENCOUNTER — Other Ambulatory Visit: Payer: Self-pay

## 2022-07-01 MED ORDER — TAMOXIFEN CITRATE 20 MG PO TABS: 20.0000 mg | ORAL_TABLET | Freq: Every day | ORAL | 3 refills | Status: DC

## 2022-07-28 ENCOUNTER — Ambulatory Visit
Admission: RE | Admit: 2022-07-28 | Discharge: 2022-07-28 | Disposition: A | Discharge status: Home / Self Care | Payer: Medicare Other | Source: Ambulatory Visit | Attending: Hematology and Oncology | Admitting: Hematology and Oncology

## 2022-07-28 DIAGNOSIS — Z1231 Encounter for screening mammogram for malignant neoplasm of breast: Secondary | ICD-10-CM

## 2022-07-30 NOTE — Progress Notes (Signed)
   Patient Care Team: Gillian Scarce, MD (Inactive) as PCP - General (Family Medicine)  DIAGNOSIS: No diagnosis found.  SUMMARY OF ONCOLOGIC HISTORY: Oncology History  Ductal carcinoma in situ (DCIS) of right breast  01/23/2017 Initial Diagnosis   Right breast biopsy 12:30 position: DCIS involving sclerosing adenosis ER 100%, PR 100%; left breast upper medial biopsy: DCIS involving sclerosing adenosis, low to intermediate grade, ER 100%, PR 95% , Tis Nx stage 0   04/07/2017 -  Anti-estrogen oral therapy   Tamoxifen 20 mg daily, patient decided to go on active surveillance with every 6 month mammograms     CHIEF COMPLIANT:   INTERVAL HISTORY: Janet Wiggins is a   ALLERGIES:  has No Known Allergies.  MEDICATIONS:  Current Outpatient Medications  Medication Sig Dispense Refill   aspirin 81 MG chewable tablet Chew 81 mg by mouth daily.     diclofenac sodium (VOLTAREN) 1 % GEL Apply topically as needed.     ergocalciferol (VITAMIN D2) 50000 units capsule Take 50,000 Units by mouth every 14 (fourteen) days.     fluocinonide ointment (LIDEX) 0.05 % Apply 1 application topically as needed.     glucosamine-chondroitin 500-400 MG tablet Take 1 tablet by mouth daily.     lisinopril (PRINIVIL,ZESTRIL) 20 MG tablet Take 20 mg by mouth daily.     mesalamine (LIALDA) 1.2 g EC tablet Take by mouth every evening.     Multiple Vitamin (MULTIVITAMIN) tablet Take 1 tablet by mouth daily.     tamoxifen (NOLVADEX) 20 MG tablet Take 1 tablet (20 mg total) by mouth daily. 90 tablet 3   zoledronic acid (RECLAST) 5 MG/100ML SOLN injection Inject 5 mg into the vein once. Once a year. Last given in December 2017.     No current facility-administered medications for this visit.    PHYSICAL EXAMINATION: ECOG PERFORMANCE STATUS: {CHL ONC ECOG PS:[phone]}  There were no vitals filed for this visit. There were no vitals filed for this visit.  BREAST:*** No palpable masses or nodules in either  right or left breasts. No palpable axillary supraclavicular or infraclavicular adenopathy no breast tenderness or nipple discharge. (exam performed in the presence of a chaperone)  LABORATORY DATA:  I have reviewed the data as listed     No data to display          No results found for: "WBC", "HGB", "HCT", "MCV", "PLT", "NEUTROABS"  ASSESSMENT & PLAN:  No problem-specific Assessment & Plan notes found for this encounter.    No orders of the defined types were placed in this encounter.  The patient has a good understanding of the overall plan. she agrees with it. she will call with any problems that may develop before the next visit here. Total time spent: 30 mins including face to face time and time spent for planning, charting and co-ordination of care   Sherlyn Lick, CMA 07/30/22   I Janan Ridge am acting as a Neurosurgeon for [employer]  ***

## 2022-07-31 ENCOUNTER — Inpatient Hospital Stay: Payer: Medicare Other | Attending: Hematology and Oncology | Admitting: Hematology and Oncology

## 2022-07-31 ENCOUNTER — Other Ambulatory Visit: Payer: Self-pay

## 2022-07-31 VITALS — BP 149/69 | HR 77 | Temp 97.7°F | Resp 18 | Ht 64.0 in | Wt 164.2 lb

## 2022-07-31 DIAGNOSIS — Z7982 Long term (current) use of aspirin: Secondary | ICD-10-CM | POA: Insufficient documentation

## 2022-07-31 DIAGNOSIS — Z79899 Other long term (current) drug therapy: Secondary | ICD-10-CM | POA: Insufficient documentation

## 2022-07-31 DIAGNOSIS — D0512 Intraductal carcinoma in situ of left breast: Secondary | ICD-10-CM | POA: Insufficient documentation

## 2022-07-31 DIAGNOSIS — D0511 Intraductal carcinoma in situ of right breast: Secondary | ICD-10-CM | POA: Insufficient documentation

## 2022-07-31 DIAGNOSIS — Z79810 Long term (current) use of selective estrogen receptor modulators (SERMs): Secondary | ICD-10-CM | POA: Diagnosis not present

## 2022-07-31 NOTE — Assessment & Plan Note (Addendum)
Right breast biopsy 12:30 position: DCIS involving sclerosing adenosis ER 100%, PR 100%;  Left breast upper medial biopsy: DCIS involving alcohol sclerosing adenosis, low to intermediate grade, ER 100%, PR 95% ,  Bilateral breast DCIS Tis Nx stage 0   Patient was not eligible to participate in COMET clinical trial because she did not have measurable disease. Patient's preference is not to undergo surgery.   Treatment plan: 1. Antiestrogen therapy with tamoxifen 20 mg daily started 04/07/2017 2. mammogram 07/28/2022 no mammographic appearance of malignancy.  Breast density category B.    Tamoxifen toxicities: Tolerating extremely well. Muscle cramps and intermittent hot flashes. Since she completed 5 years of tamoxifen therapy we can discontinue it at this time.  Continue with annual mammograms and follow-up on an as-needed basis.

## 2023-07-02 IMAGING — MG MM DIGITAL SCREENING BILAT W/ TOMO AND CAD
8 series · 8 of 24 positions shown · non-contrast
Comparison: Previous exam(s).

CLINICAL DATA: Screening.

EXAM:
DIGITAL SCREENING BILATERAL MAMMOGRAM WITH TOMOSYNTHESIS AND CAD
TECHNIQUE: Bilateral screening digital craniocaudal and mediolateral oblique
mammograms were obtained. Bilateral screening digital breast
tomosynthesis was performed. The images were evaluated with
computer-aided detection.

[L CC synth-2D]
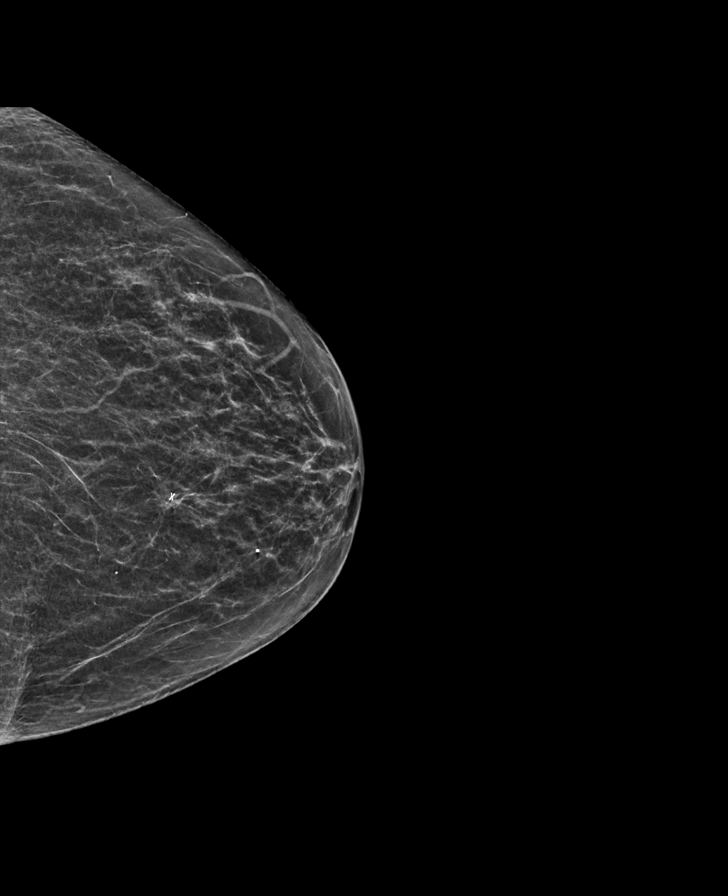

[L MLO synth-2D]
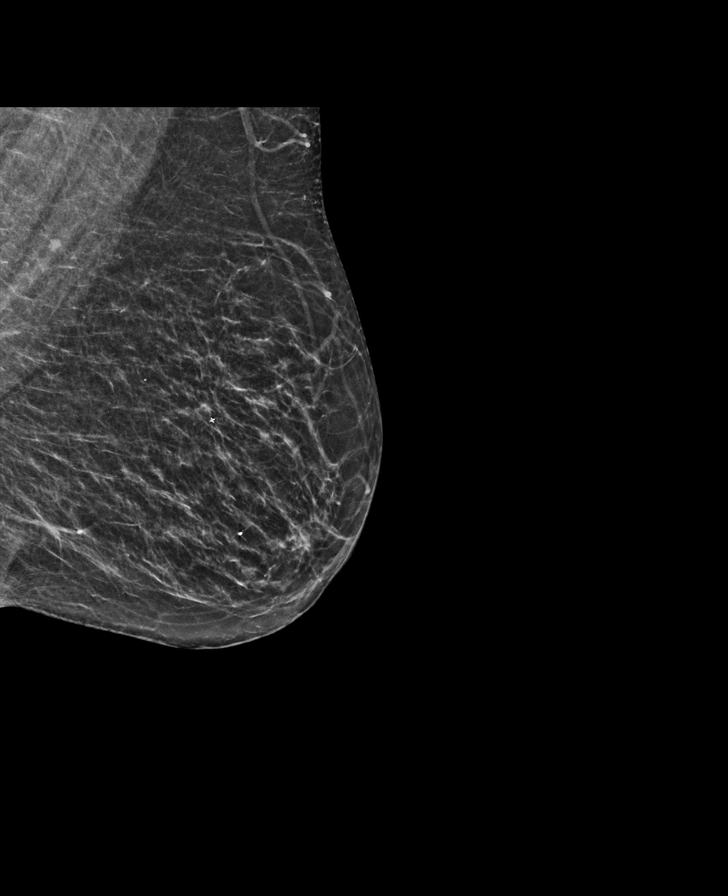

[R CC synth-2D]
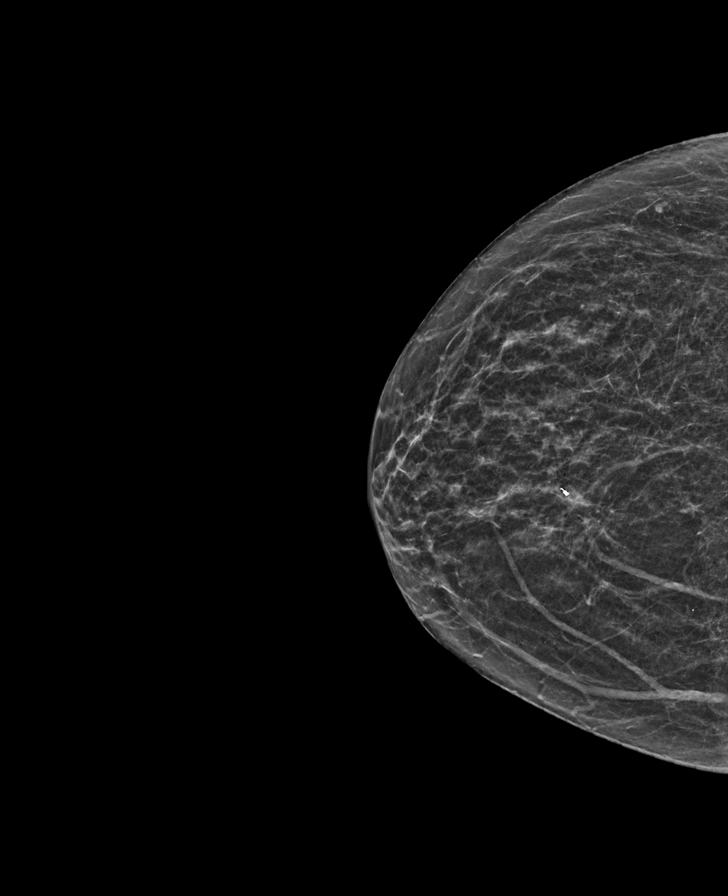

[R MLO synth-2D]
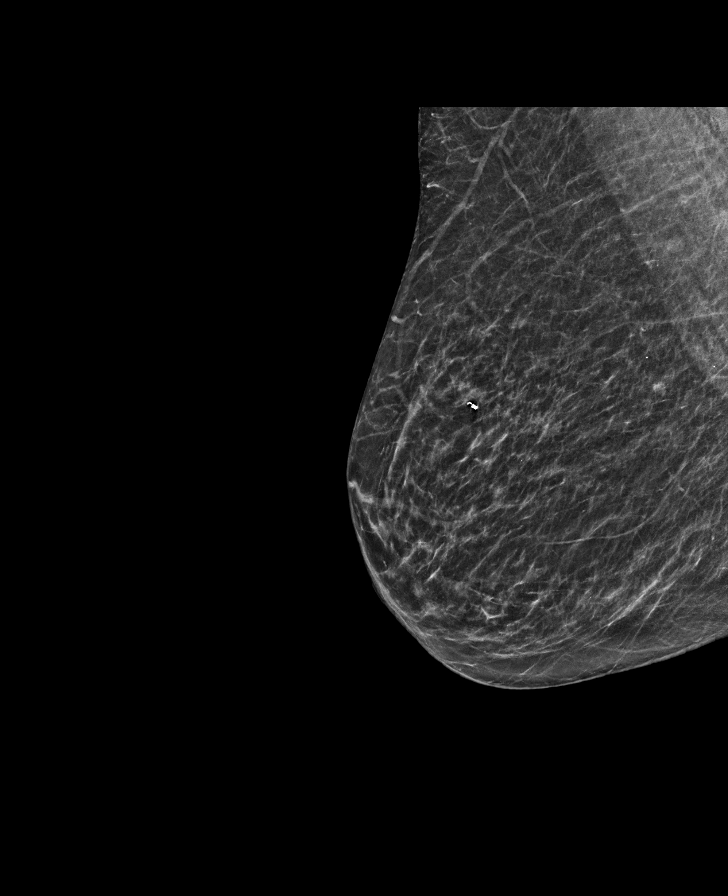

[L CC tomo · tomo slice 25/50.0]
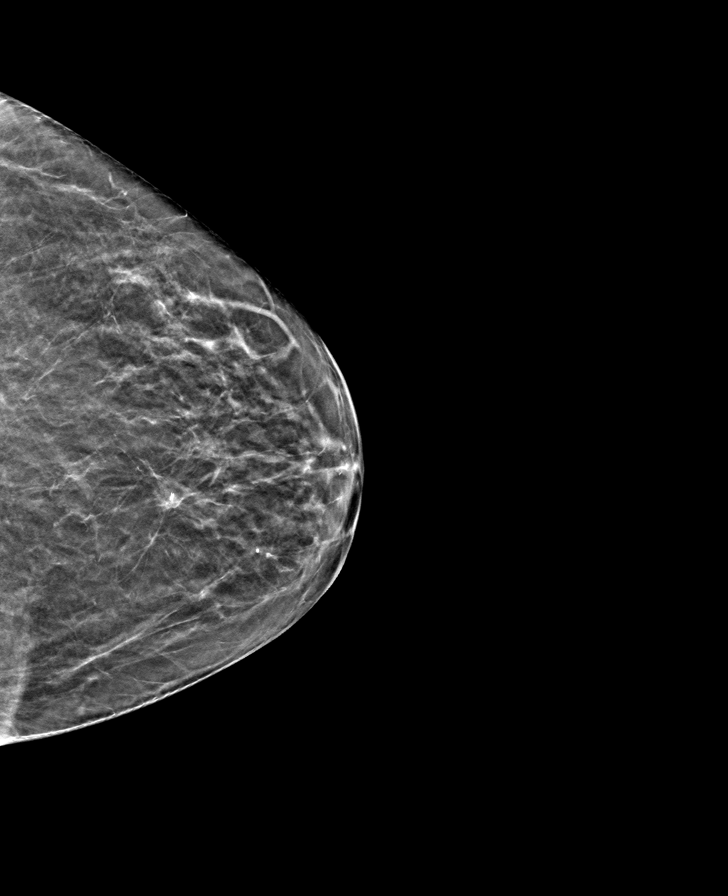

[L MLO tomo · tomo slice 27/54.0]
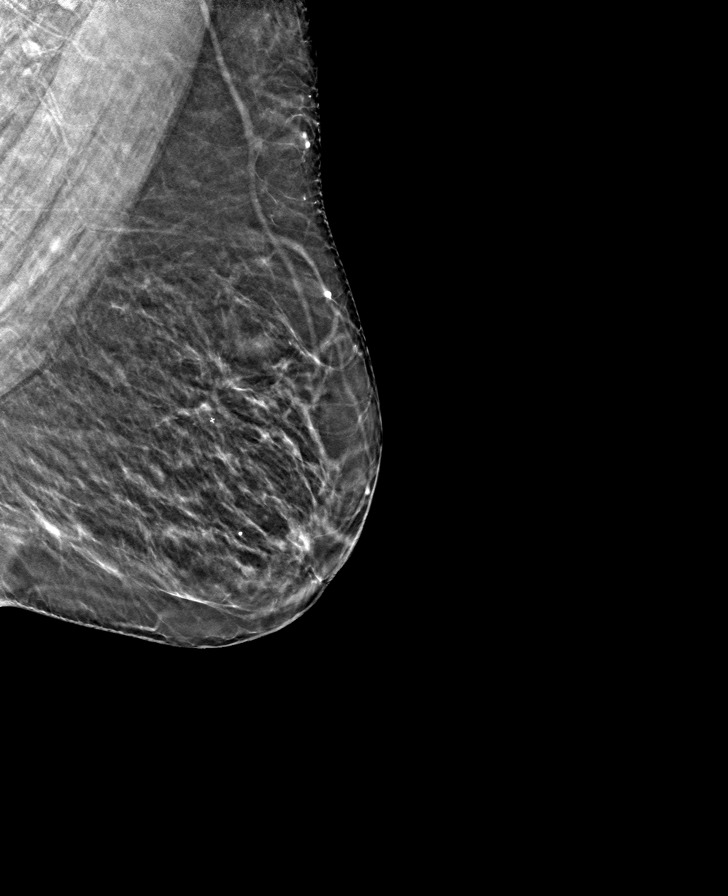

[R MLO tomo · tomo slice 27/53.0]
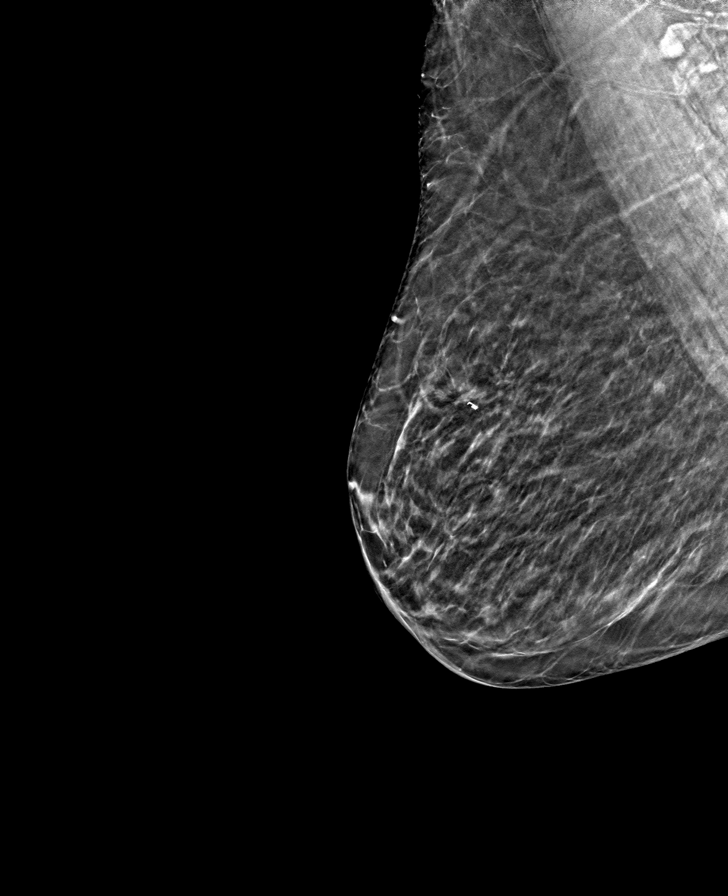

[R CC tomo · tomo slice 24/47.0]
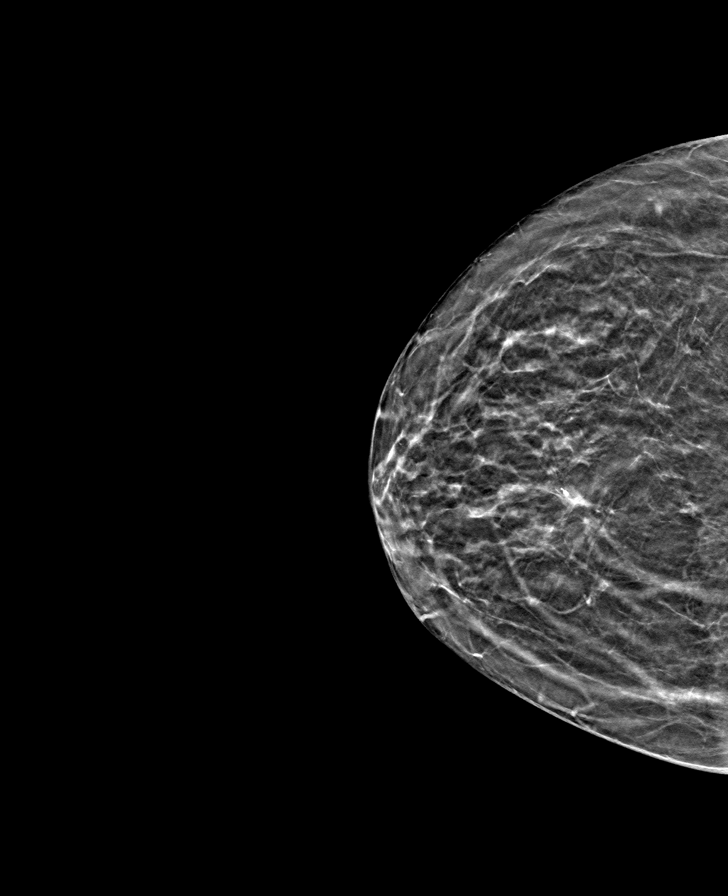

[8 of 24 positions shown; findings below may reference images not displayed]

ACR Breast Density Category b: There are scattered areas of
fibroglandular density.
FINDINGS: There are no findings suspicious for malignancy.
IMPRESSION: No mammographic evidence of malignancy. A result letter of this
screening mammogram will be mailed directly to the patient.

RECOMMENDATION:
Screening mammogram in one year. (Code:51-O-LD2)

BI-RADS CATEGORY  1: Negative.
